# Patient Record
Sex: Male | Born: 1970 | Race: White | Hispanic: No | Marital: Married | Smoking: Never smoker
Health system: Southern US, Community
[De-identification: ages and names within clinical notes are randomized; demographics above are authoritative.]

---

## 2014-12-01 ENCOUNTER — Emergency Department: Payer: No Typology Code available for payment source

## 2014-12-01 ENCOUNTER — Emergency Department
Admission: EM | Admit: 2014-12-01 | Discharge: 2014-12-01 | Disposition: A | Discharge status: Home / Self Care | Payer: No Typology Code available for payment source | Attending: Emergency Medicine | Admitting: Emergency Medicine

## 2014-12-01 ENCOUNTER — Encounter: Payer: Self-pay | Admitting: Emergency Medicine

## 2014-12-01 DIAGNOSIS — Z23 Encounter for immunization: Secondary | ICD-10-CM | POA: Diagnosis not present

## 2014-12-01 DIAGNOSIS — S20312A Abrasion of left front wall of thorax, initial encounter: Secondary | ICD-10-CM | POA: Diagnosis not present

## 2014-12-01 DIAGNOSIS — S61412A Laceration without foreign body of left hand, initial encounter: Secondary | ICD-10-CM | POA: Diagnosis not present

## 2014-12-01 DIAGNOSIS — W228XXA Striking against or struck by other objects, initial encounter: Secondary | ICD-10-CM | POA: Diagnosis not present

## 2014-12-01 DIAGNOSIS — S51812A Laceration without foreign body of left forearm, initial encounter: Secondary | ICD-10-CM | POA: Diagnosis present

## 2014-12-01 DIAGNOSIS — Y990 Civilian activity done for income or pay: Secondary | ICD-10-CM | POA: Insufficient documentation

## 2014-12-01 DIAGNOSIS — Y9289 Other specified places as the place of occurrence of the external cause: Secondary | ICD-10-CM | POA: Insufficient documentation

## 2014-12-01 DIAGNOSIS — Y9389 Activity, other specified: Secondary | ICD-10-CM | POA: Insufficient documentation

## 2014-12-01 MED ORDER — LIDOCAINE-EPINEPHRINE 2 %-1:100000 IJ SOLN: 30.0000 mL | Freq: Once | INTRAMUSCULAR | Status: DC

## 2014-12-01 MED ORDER — LIDOCAINE-EPINEPHRINE (PF) 1 %-1:200000 IJ SOLN
INTRAMUSCULAR | Status: AC
  Filled 2014-12-01: qty 30

## 2014-12-01 MED ORDER — TETANUS-DIPHTH-ACELL PERTUSSIS 5-2.5-18.5 LF-MCG/0.5 IM SUSP
0.5000 mL | Freq: Once | INTRAMUSCULAR | Status: AC
  Administered 2014-12-01: 0.5 mL via INTRAMUSCULAR
  Filled 2014-12-01: qty 0.5

## 2014-12-01 NOTE — ED Notes (Signed)
While fixing the awning of the camper, lacerations to left arm and chest.  Three small lacerations to left forearm and abrasions noted to left chest.  Wounds cleansed with NS and Dry sterile dressing applied to left forearm.

## 2014-12-01 NOTE — ED Notes (Signed)
Patient has lacerations x4 to left arm and hand and also has lacerations x7 to left chest  From a piece of his "painted metal camper". Both wound areas are beefy red and moist with slight redness and edema encircling area.  Patient rates pain 3 out of 10. Patient describes tingling sensation to left hand that does not radiate any where else.

## 2014-12-01 NOTE — Discharge Instructions (Signed)
Laceration Care, Adult  A laceration is a cut that goes through all layers of the skin. The cut also goes into the tissue that is right under the skin. Some cuts heal on their own. Others need to be closed with stitches (sutures), staples, skin adhesive strips, or wound glue. Taking care of your cut lowers your risk of infection and helps your cut to heal better.  HOW TO TAKE CARE OF YOUR CUT  For stitches or staples:  · Keep the wound clean and dry.  · If you were given a bandage (dressing), you should change it at least one time per day or as told by your doctor. You should also change it if it gets wet or dirty.  · Keep the wound completely dry for the first 24 hours or as told by your doctor. After that time, you may take a shower or a bath. However, make sure that the wound is not soaked in water until after the stitches or staples have been removed.  · Clean the wound one time each day or as told by your doctor:    Wash the wound with soap and water.    Rinse the wound with water until all of the soap comes off.    Pat the wound dry with a clean towel. Do not rub the wound.  · After you clean the wound, put a thin layer of antibiotic ointment on it as told by your doctor. This ointment:    Helps to prevent infection.    Keeps the bandage from sticking to the wound.  · Have your stitches or staples removed as told by your doctor.  If your doctor used skin adhesive strips:   · Keep the wound clean and dry.  · If you were given a bandage, you should change it at least one time per day or as told by your doctor. You should also change it if it gets dirty or wet.  · Do not get the skin adhesive strips wet. You can take a shower or a bath, but be careful to keep the wound dry.  · If the wound gets wet, pat it dry with a clean towel. Do not rub the wound.  · Skin adhesive strips fall off on their own. You can trim the strips as the wound heals. Do not remove any strips that are still stuck to the wound. They will  fall off after a while.  If your doctor used wound glue:  · Try to keep your wound dry, but you may briefly wet it in the shower or bath. Do not soak the wound in water, such as by swimming.  · After you take a shower or a bath, gently pat the wound dry with a clean towel. Do not rub the wound.  · Do not do any activities that will make you really sweaty until the skin glue has fallen off on its own.  · Do not apply liquid, cream, or ointment medicine to your wound while the skin glue is still on.  · If you were given a bandage, you should change it at least one time per day or as told by your doctor. You should also change it if it gets dirty or wet.  · If a bandage is placed over the wound, do not let the tape for the bandage touch the skin glue.  · Do not pick at the glue. The skin glue usually stays on for 5-10 days. Then, it   falls off of the skin.  General Instructions   · To help prevent scarring, make sure to cover your wound with sunscreen whenever you are outside after stitches are removed, after adhesive strips are removed, or when wound glue stays in place and the wound is healed. Make sure to wear a sunscreen of at least 30 SPF.  · Take over-the-counter and prescription medicines only as told by your doctor.  · If you were given antibiotic medicine or ointment, take or apply it as told by your doctor. Do not stop using the antibiotic even if your wound is getting better.  · Do not scratch or pick at the wound.  · Keep all follow-up visits as told by your doctor. This is important.  · Check your wound every day for signs of infection. Watch for:    Redness, swelling, or pain.    Fluid, blood, or pus.  · Raise (elevate) the injured area above the level of your heart while you are sitting or lying down, if possible.  GET HELP IF:  · You got a tetanus shot and you have any of these problems at the injection site:    Swelling.    Very bad pain.    Redness.    Bleeding.  · You have a fever.  · A wound that was  closed breaks open.  · You notice a bad smell coming from your wound or your bandage.  · You notice something coming out of the wound, such as wood or glass.  · Medicine does not help your pain.  · You have more redness, swelling, or pain at the site of your wound.  · You have fluid, blood, or pus coming from your wound.  · You notice a change in the color of your skin near your wound.  · You need to change the bandage often because fluid, blood, or pus is coming from the wound.  · You start to have a new rash.  · You start to have numbness around the wound.  GET HELP RIGHT AWAY IF:  · You have very bad swelling around the wound.  · Your pain suddenly gets worse and is very bad.  · You notice painful lumps near the wound or on skin that is anywhere on your body.  · You have a red streak going away from your wound.  · The wound is on your hand or foot and you cannot move a finger or toe like you usually can.  · The wound is on your hand or foot and you notice that your fingers or toes look pale or bluish.     This information is not intended to replace advice given to you by your health care provider. Make sure you discuss any questions you have with your health care provider.     Document Released: 07/08/2007 Document Revised: 06/05/2014 Document Reviewed: 01/15/2014  [employer]vier Interactive Patient Education ©2016 [employer]

## 2014-12-01 NOTE — ED Provider Notes (Signed)
CSN: [id number]     Arrival date & time 12/01/14  1610 History   First MD Initiated Contact with Patient 12/01/14 1645     Chief Complaint  Patient presents with  . Extremity Laceration     HPI Comments: 44 year old right hand dominant male presents today complaining of left hand and forearm lacerations secondary to injury that occurred just prior to arrival. Pt reports he was working on his camper when something that was spring loaded hit his arm and chest. Tetanus out of date. Has minimal pain to his hand/forearm. Abrasions also sustained to left upper chest.    History reviewed. No pertinent past medical history. History reviewed. No pertinent past surgical history. No family history on file. Social History  Substance Use Topics  . Smoking status: Never Smoker   . Smokeless tobacco: None  . Alcohol Use: None    Review of Systems  Musculoskeletal: Positive for arthralgias.  Skin: Positive for wound.  All other systems reviewed and are negative.     Allergies  Review of patient's allergies indicates not on file.  Home Medications   Prior to Admission medications   Not on File   BP 111/71 mmHg  Pulse 81  Temp(Src) 98.1 F (36.7 C) (Oral)  Resp 16  Ht  (1.778 m)  Wt 195 lb (88.451 kg)  BMI 27.98 kg/m2  SpO2 100% Physical Exam  Constitutional: He is oriented to person, place, and time. Vital signs are normal. He appears well-developed and well-nourished. He is active.  Non-toxic appearance. He does not have a sickly appearance. He does not appear ill.  HENT:  Head: Normocephalic and atraumatic.  Musculoskeletal: Normal range of motion. He exhibits tenderness.       Left hand: He exhibits tenderness, bony tenderness and laceration. He exhibits normal range of motion, normal capillary refill, no deformity and no swelling. Normal sensation noted. Normal strength noted.  TTP at base of 1st metacarpal  C shaped laceration approximately 4cm in length  overlying  base of 1st metatarsal  Neurological: He is alert and oriented to person, place, and time.  Skin: Skin is warm and dry.  Multiple abrasions overlying left chest  3 linear lacerations to left forearm. Most proximal is 2cm, next is 1cm, most distal is 2cm.   Psychiatric: He has a normal mood and affect. His behavior is normal. Judgment and thought content normal.  Nursing note and vitals reviewed.   ED Course  .Marland KitchenLaceration Repair Date/Time: 12/01/2014 6:22 PM Performed by: Luvenia Redden Authorized by: Luvenia Redden Consent: Verbal consent obtained. Risks and benefits: risks, benefits and alternatives were discussed Consent given by: patient Patient understanding: patient states understanding of the procedure being performed Patient consent: the patient's understanding of the procedure matches consent given Procedure consent: procedure consent matches procedure scheduled Relevant documents: relevant documents present and verified Imaging studies: imaging studies available Required items: required blood products, implants, devices, and special equipment available Patient identity confirmed: verbally with patient Time out: Immediately prior to procedure a "time out" was called to verify the correct patient, procedure, equipment, support staff and site/side marked as required. Body area: upper extremity Location details: left hand Laceration length: 5 cm Foreign bodies: no foreign bodies Tendon involvement: none Nerve involvement: none Vascular damage: no Anesthesia: local infiltration Local anesthetic: lidocaine 1% with epinephrine Anesthetic total: 8 ml Patient sedated: no Preparation: Patient was prepped and draped in the usual sterile fashion. Irrigation solution: saline Irrigation method: syringe Amount of  cleaning: extensive Debridement: none Degree of undermining: none Skin closure: 5-0 nylon Number of sutures: 7 Technique: simple Approximation: close Approximation  difficulty: simple Dressing: 4x4 sterile gauze Patient tolerance: Patient tolerated the procedure well with no immediate complications  .Marland Kitchen.Laceration Repair Date/Time: 12/01/2014 6:23 PM Performed by: Luvenia ReddenWEAVIL, Michayla Mcneil V Authorized by: Luvenia ReddenWEAVIL, Deo Mehringer V Consent: Verbal consent obtained. Risks and benefits: risks, benefits and alternatives were discussed Consent given by: patient Patient understanding: patient states understanding of the procedure being performed Patient consent: the patient's understanding of the procedure matches consent given Procedure consent: procedure consent matches procedure scheduled Relevant documents: relevant documents present and verified Test results: test results available and properly labeled Site marked: the operative site was marked Required items: required blood products, implants, devices, and special equipment available Patient identity confirmed: verbally with patient Time out: Immediately prior to procedure a "time out" was called to verify the correct patient, procedure, equipment, support staff and site/side marked as required. Body area: upper extremity Location details: left lower arm Laceration length: 3 cm Foreign bodies: no foreign bodies Tendon involvement: none Nerve involvement: none Vascular damage: no Anesthesia: local infiltration Local anesthetic: lidocaine 1% with epinephrine Anesthetic total: 5 ml Patient sedated: no Preparation: Patient was prepped and draped in the usual sterile fashion. Irrigation solution: saline Irrigation method: syringe Amount of cleaning: standard Debridement: none Degree of undermining: none Skin closure: 5-0 nylon Number of sutures: 3 Technique: simple Approximation: close Approximation difficulty: simple Dressing: 4x4 sterile gauze Patient tolerance: Patient tolerated the procedure well with no immediate complications  .Marland Kitchen.Laceration Repair Date/Time: 12/01/2014 6:24 PM Performed by: Luvenia ReddenWEAVIL, Abdur Hoglund  V Authorized by: Luvenia ReddenWEAVIL, Aleecia Tapia V Consent: Verbal consent obtained. Risks and benefits: risks, benefits and alternatives were discussed Consent given by: patient Patient understanding: patient states understanding of the procedure being performed Patient consent: the patient's understanding of the procedure matches consent given Procedure consent: procedure consent matches procedure scheduled Relevant documents: relevant documents present and verified Test results: test results available and properly labeled Site marked: the operative site was marked Imaging studies: imaging studies available Required items: required blood products, implants, devices, and special equipment available Patient identity confirmed: verbally with patient Time out: Immediately prior to procedure a "time out" was called to verify the correct patient, procedure, equipment, support staff and site/side marked as required. Body area: upper extremity Location details: left lower arm Laceration length: 2 cm Foreign bodies: no foreign bodies Tendon involvement: none Nerve involvement: none Vascular damage: no Anesthesia: local infiltration Local anesthetic: lidocaine 1% with epinephrine Anesthetic total: 5 ml Patient sedated: no Preparation: Patient was prepped and draped in the usual sterile fashion. Irrigation solution: saline Irrigation method: syringe Debridement: none Skin closure: 5-0 nylon Number of sutures: 2 Technique: simple Approximation: close Approximation difficulty: simple Dressing: 4x4 sterile gauze Patient tolerance: Patient tolerated the procedure well with no immediate complications   (including critical care time) Labs Review Labs Reviewed - No data to display  Imaging Review Dg Hand Complete Left  12/01/2014  CLINICAL DATA:  Laceration to the left hand and wrist. EXAM: LEFT HAND - COMPLETE 3+ VIEW COMPARISON:  None. FINDINGS: There is no evidence of fracture or dislocation. There is  no evidence of arthropathy or other focal bone abnormality. Gas bubbles seen within the soft tissues adjacent to the radiocarpal joint, likely due to laceration. IMPRESSION: No acute fracture or dislocation identified about the left hand. Electronically Signed   By: Ted Mcalpineobrinka  Dimitrova M.D.   On: 12/01/2014 18:10   I have  personally reviewed and evaluated these images and lab results as part of my medical decision-making.   EKG Interpretation None      MDM  3 separate laceration repairs to the hand and forearms. In total there were 12 sutures placed. Advised to keep clean and dry, return to ER or PCP in 10 days for suture removal. Tetanus updated in ER today.  Final diagnoses:  Forearm laceration, left, initial encounter  Hand laceration, left, initial encounter        Luvenia Redden, PA-C 12/01/14 1826  Wilber Oliphant V, PA-C 12/01/14 1827  Governor Rooks, MD 12/01/14 2311

## 2016-12-16 IMAGING — CR DG HAND COMPLETE 3+V*L*
1 series · 3 of 3 positions shown · non-contrast
Comparison: None.

CLINICAL DATA: Laceration to the left hand and wrist.

EXAM:
LEFT HAND - COMPLETE 3+ VIEW

[Series 1: pa · 0.17mm/px · 3 of 3 slices shown]
[im 1/3]
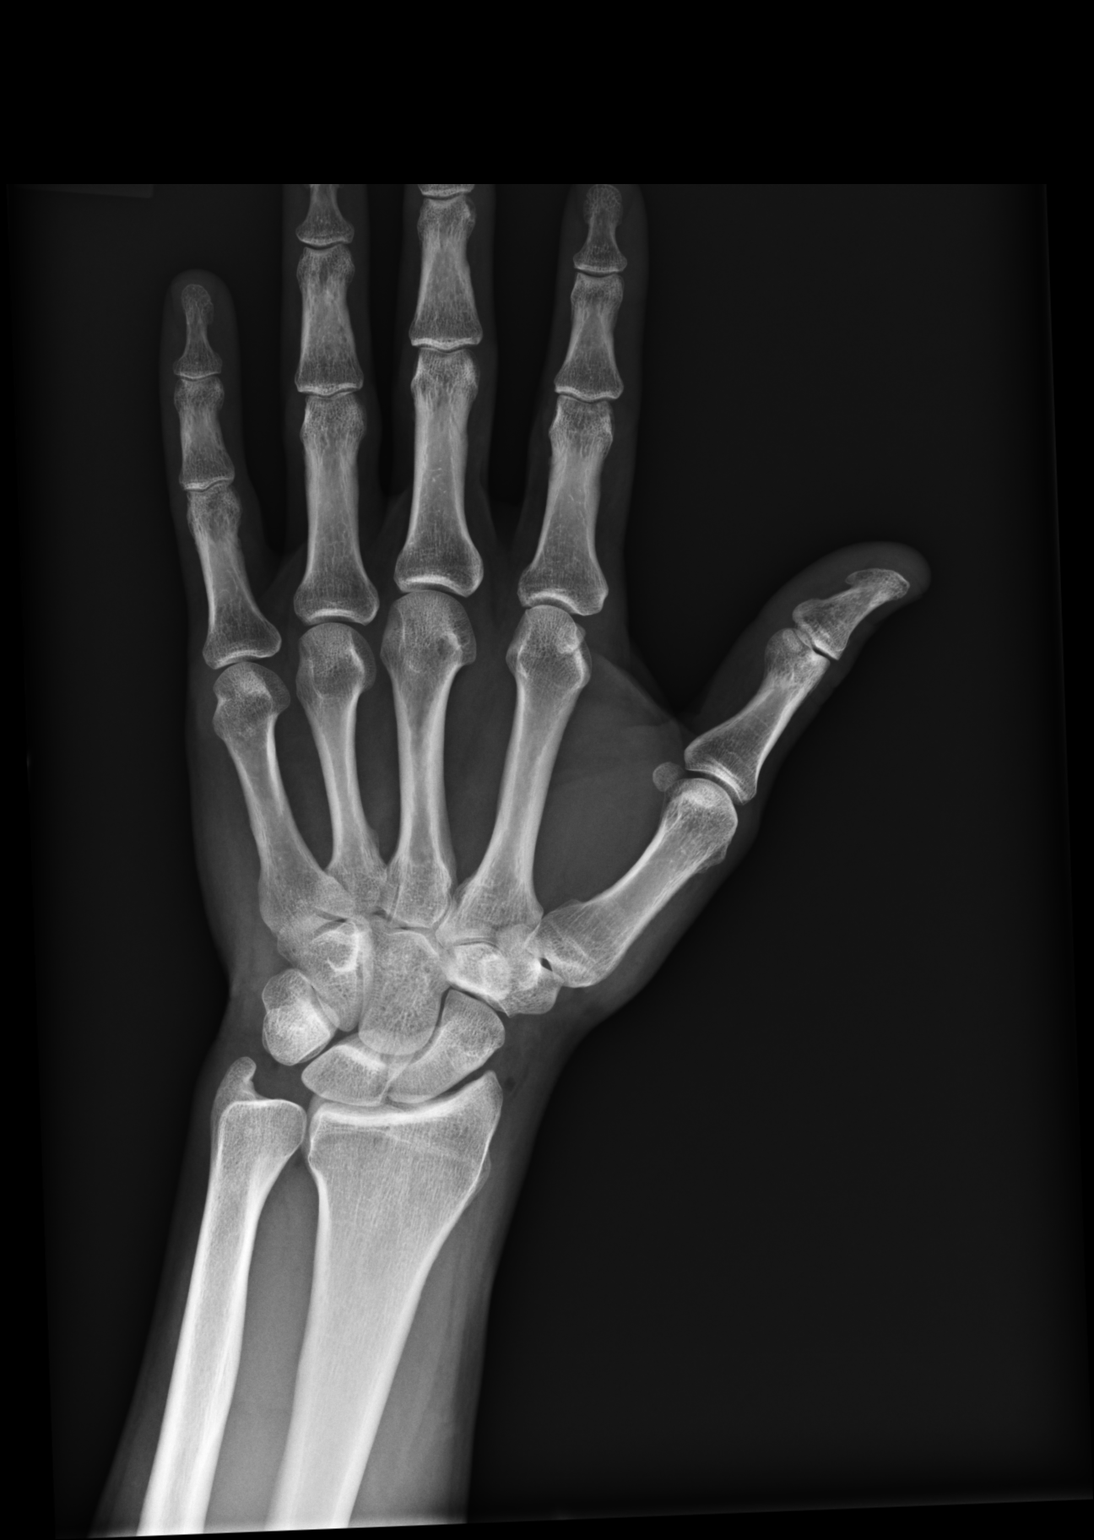
[im 2/3]
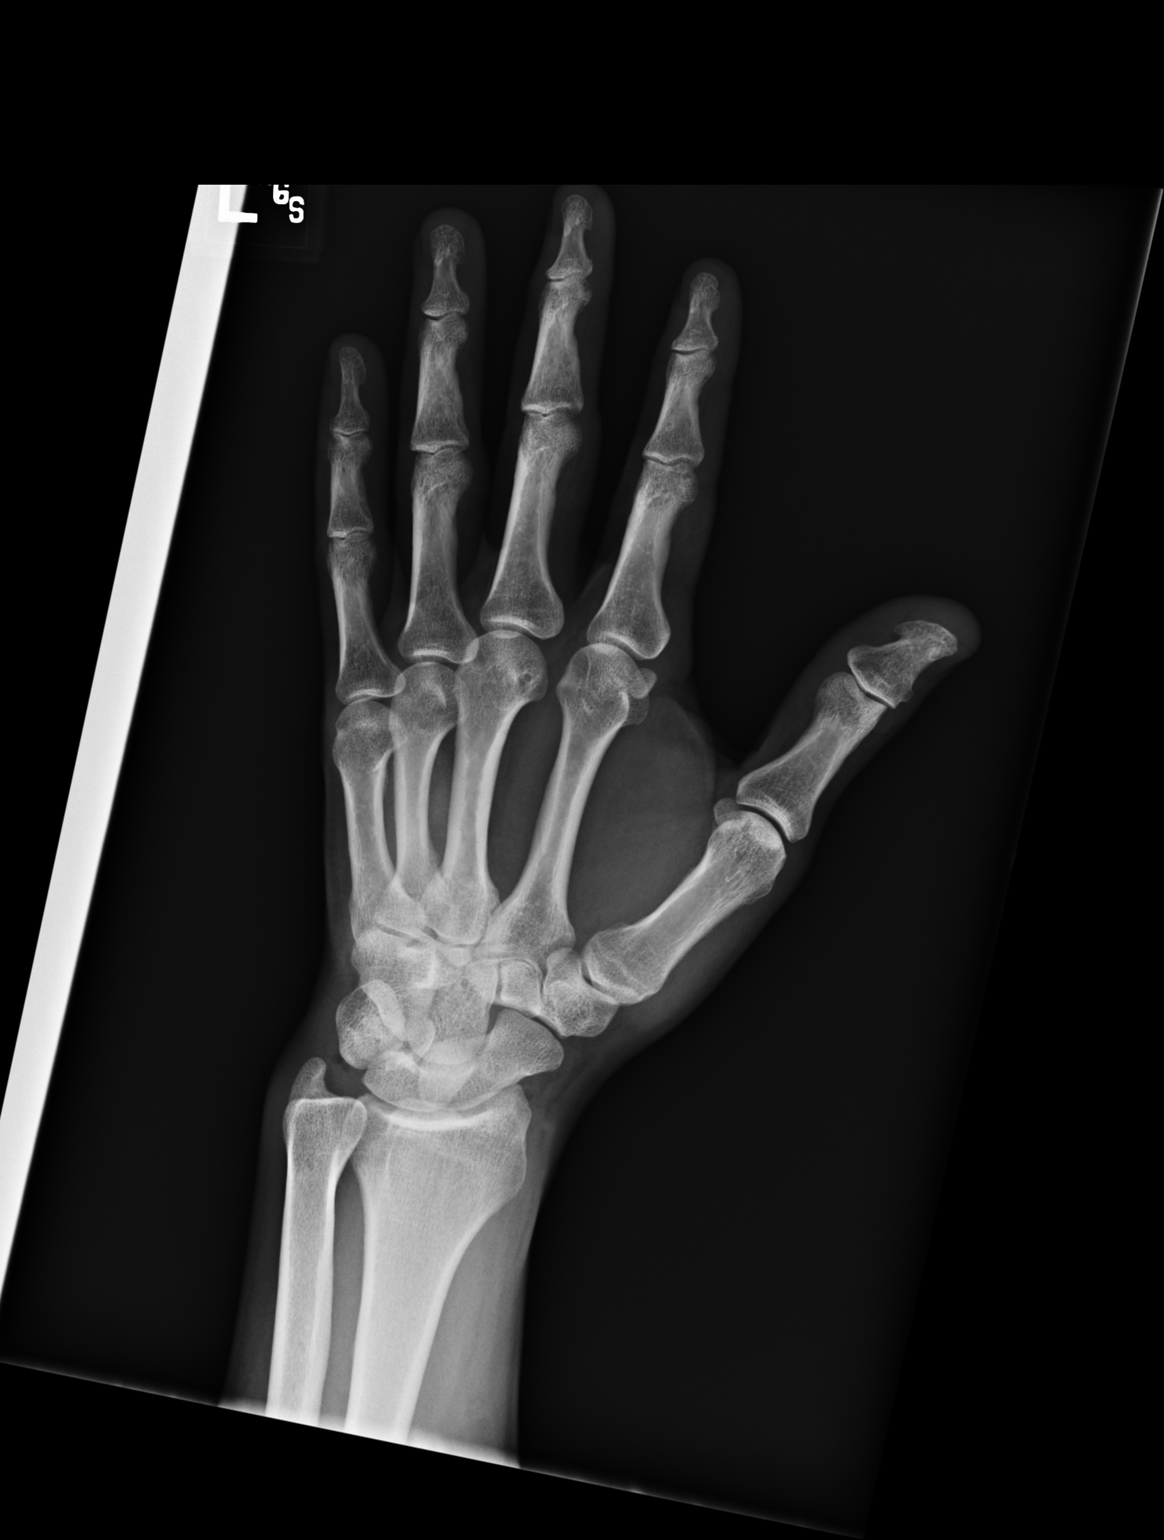
[im 3/3]
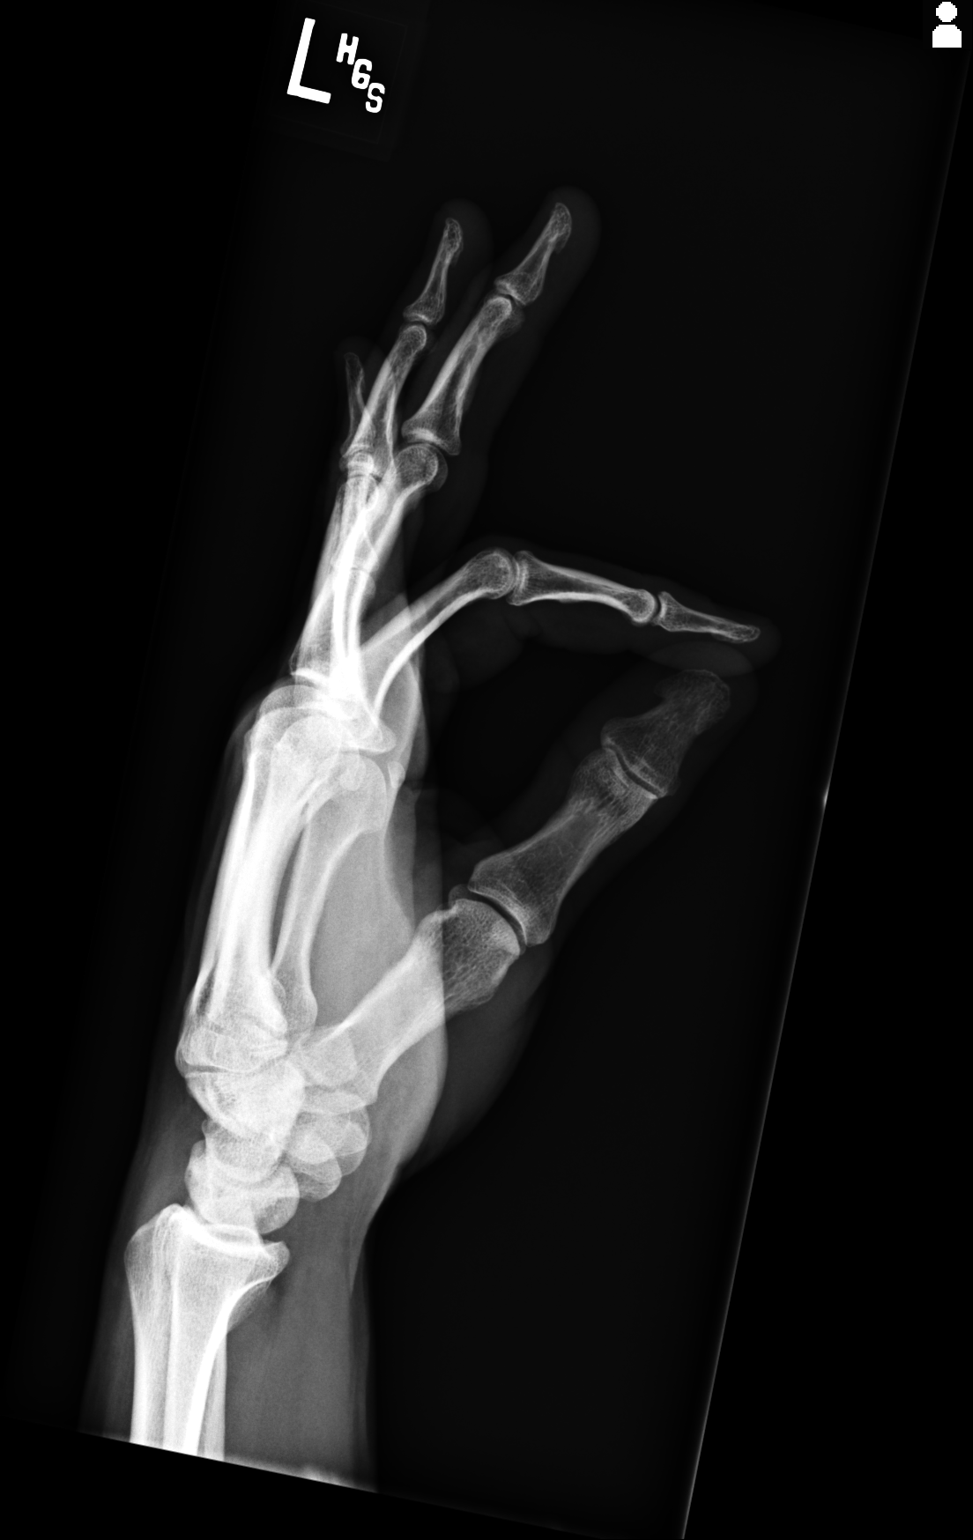

[3 of 3 positions shown; findings below may reference images not displayed]

FINDINGS: There is no evidence of fracture or dislocation. There is no
evidence of arthropathy or other focal bone abnormality. Gas bubbles
seen within the soft tissues adjacent to the radiocarpal joint,
likely due to laceration.
IMPRESSION: No acute fracture or dislocation identified about the left hand.
